# Patient Record
Sex: Male | Born: 1955 | Race: White | Hispanic: No | Marital: Married | Smoking: Current some day smoker
Health system: Southern US, Community
[De-identification: ages and names within clinical notes are randomized; demographics above are authoritative.]

## PROBLEM LIST (undated history)

## (undated) DIAGNOSIS — H35039 Hypertensive retinopathy, unspecified eye: Secondary | ICD-10-CM

## (undated) DIAGNOSIS — H269 Unspecified cataract: Secondary | ICD-10-CM

## (undated) HISTORY — DX: Unspecified cataract: H26.9

## (undated) HISTORY — PX: VASECTOMY: SHX75

## (undated) HISTORY — DX: Hypertensive retinopathy, unspecified eye: H35.039

---

## 1998-11-07 ENCOUNTER — Other Ambulatory Visit: Admission: RE | Admit: 1998-11-07 | Discharge: 1998-11-07 | Payer: Self-pay | Admitting: Urology

## 2014-10-23 ENCOUNTER — Emergency Department (HOSPITAL_COMMUNITY)
Admission: EM | Admit: 2014-10-23 | Discharge: 2014-10-23 | Disposition: A | Discharge status: Home / Self Care | Payer: 59 | Attending: Emergency Medicine | Admitting: Emergency Medicine

## 2014-10-23 ENCOUNTER — Encounter (HOSPITAL_COMMUNITY): Payer: Self-pay | Admitting: Emergency Medicine

## 2014-10-23 DIAGNOSIS — Y9389 Activity, other specified: Secondary | ICD-10-CM | POA: Diagnosis not present

## 2014-10-23 DIAGNOSIS — Z720 Tobacco use: Secondary | ICD-10-CM | POA: Insufficient documentation

## 2014-10-23 DIAGNOSIS — S51812A Laceration without foreign body of left forearm, initial encounter: Secondary | ICD-10-CM | POA: Diagnosis not present

## 2014-10-23 DIAGNOSIS — Y288XXA Contact with other sharp object, undetermined intent, initial encounter: Secondary | ICD-10-CM | POA: Insufficient documentation

## 2014-10-23 DIAGNOSIS — Z23 Encounter for immunization: Secondary | ICD-10-CM | POA: Insufficient documentation

## 2014-10-23 DIAGNOSIS — Y9289 Other specified places as the place of occurrence of the external cause: Secondary | ICD-10-CM | POA: Diagnosis not present

## 2014-10-23 MED ORDER — TETANUS-DIPHTH-ACELL PERTUSSIS 5-2.5-18.5 LF-MCG/0.5 IM SUSP
0.5000 mL | Freq: Once | INTRAMUSCULAR | Status: AC
  Administered 2014-10-23: 0.5 mL via INTRAMUSCULAR
  Filled 2014-10-23: qty 0.5

## 2014-10-23 MED ORDER — LIDOCAINE-EPINEPHRINE (PF) 2 %-1:200000 IJ SOLN
10.0000 mL | Freq: Once | INTRAMUSCULAR | Status: AC
  Administered 2014-10-23: 10 mL
  Filled 2014-10-23: qty 10

## 2014-10-23 NOTE — ED Notes (Signed)
Bleeding is controlled at this time.    
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
## 2014-10-23 NOTE — ED Provider Notes (Signed)
CSN: [id number]     Arrival date & time 10/23/14  2055 History  This chart was scribed for non-physician practitioner Alphonzo DublinKelly Heumes, PA-C working with Toy BakerAnthony T Allen, MD by Conchita ParisNadim Abuhashem, ED Scribe. This patient was seen in WTR7/WTR7 and the patient's care was started at 10:13 PM.   Chief Complaint  Patient presents with  . Extremity Laceration   HPI HPI Comments: Philip Fox is a 58 y.o. male who presents to the Emergency Department complaining of a right arm laceration at 8:30 pm. He cut himself on the sharp edge of his microwave while trying to lift up and it twisted into his arm. He rates his pain as 2/10 but when uncovers the wound he rates it as 5/10. Pain is "like a bruise". He denies loss of sensation in his left hand. Last tetanus unknown.  History reviewed. No pertinent past medical history. Past Surgical History  Procedure Laterality Date  . Vasectomy     No family history on file. History  Substance Use Topics  . Smoking status: Current Some Day Smoker  . Smokeless tobacco: Not on file  . Alcohol Use: Yes    Review of Systems  Skin: Positive for wound.  Neurological: Negative for numbness.  All other systems reviewed and are negative.   Allergies  Review of patient's allergies indicates no known allergies.  Home Medications   Prior to Admission medications   Not on File   BP 125/84 mmHg  Pulse 65  Temp(Src) 98.8 F (37.1 C) (Oral)  Resp 16  SpO2 100%   Physical Exam  Constitutional: He is oriented to person, place, and time. He appears well-developed and well-nourished. No distress.  Nontoxic/nonseptic appearing  HENT:  Head: Normocephalic and atraumatic.  Eyes: Conjunctivae and EOM are normal. No scleral icterus.  Neck: Normal range of motion.  Cardiovascular: Normal rate, regular rhythm and intact distal pulses.   Pulmonary/Chest: Effort normal. No respiratory distress.  Musculoskeletal: Normal range of motion.       Left forearm: He exhibits  laceration.       Arms: Normal ROM of L forearm. Normal pronation and supination. No exposed muscles or tendon.  Neurological: He is alert and oriented to person, place, and time. He exhibits normal muscle tone. Coordination normal.  Sensation to light touch intact.  Skin: Skin is warm and dry. No rash noted. He is not diaphoretic. No erythema. No pallor.  4cm laceration to the volar L forearm  Psychiatric: He has a normal mood and affect. His behavior is normal.  Nursing note and vitals reviewed.   ED Course  Procedures  DIAGNOSTIC STUDIES: Oxygen Saturation is 100% on room air, normal by my interpretation.    COORDINATION OF CARE: 10:18 PM Discussed treatment plan with pt at bedside and pt agreed to plan.  Labs Review Labs Reviewed - No data to display  Imaging Review No results found.   EKG Interpretation None     LACERATION REPAIR Performed by: Antony MaduraHUMES, Sunshyne Horvath Authorized by: Antony MaduraHUMES, Fleetwood Pierron Consent: Verbal consent obtained. Risks and benefits: risks, benefits and alternatives were discussed Consent given by: patient Patient identity confirmed: provided demographic data Prepped and Draped in normal sterile fashion Wound explored  Laceration Location: L volar forearm  Laceration Length: 4.5cm  No Foreign Bodies seen or palpated  Anesthesia: local infiltration  Local anesthetic: lidocaine 2% with epinephrine  Anesthetic total: 8 ml  Irrigation method: syringe Amount of cleaning: standard  Skin closure: 5-0 ethilon  Number of sutures: 6  Technique: simple interrupted  Patient tolerance: Patient tolerated the procedure well with no immediate complications.  MDM   Final diagnoses:  Laceration of forearm, left, initial encounter    Tdap booster given. Pressure irrigation performed. Laceration occurred < 8 hours prior to repair which was well tolerated. Pt has no comorbidities to effect normal wound healing. Discussed suture home care with pt and answered  questions. Pt to follow up for wound check and suture removal in 7 days. Pt is hemodynamically stable with no complaints prior to dc. Patient agreeable to plan with no unaddressed concerns.   Filed Vitals:   10/23/14 2113  BP: 125/84  Pulse: 65  Temp: 98.8 F (37.1 C)  TempSrc: Oral  Resp: 16  SpO2: 100%     Antony MaduraKelly Francesco Provencal, PA-C 10/23/14 2320

## 2014-10-23 NOTE — ED Notes (Signed)
Applied a clean dressing to suture. Applied Bacitracin to sutures. Covered with 4x4 gauze and kerlix. Secured with tape. Pt tolerated it well.

## 2014-10-23 NOTE — ED Notes (Signed)
Pt states he was putting in a microwave and cut his L forearm. Bleeding controlled at this time. Alert and oriented.

## 2014-10-23 NOTE — Discharge Instructions (Signed)

## 2018-06-15 ENCOUNTER — Ambulatory Visit
Admission: RE | Admit: 2018-06-15 | Discharge: 2018-06-15 | Disposition: A | Discharge status: Home / Self Care | Payer: BLUE CROSS/BLUE SHIELD | Source: Ambulatory Visit | Attending: Family Medicine | Admitting: Family Medicine

## 2018-06-15 ENCOUNTER — Other Ambulatory Visit: Payer: Self-pay | Admitting: Family Medicine

## 2018-06-15 DIAGNOSIS — M5416 Radiculopathy, lumbar region: Secondary | ICD-10-CM

## 2018-06-15 DIAGNOSIS — M25551 Pain in right hip: Secondary | ICD-10-CM | POA: Diagnosis not present

## 2018-06-15 DIAGNOSIS — M79604 Pain in right leg: Secondary | ICD-10-CM | POA: Diagnosis not present

## 2018-06-15 DIAGNOSIS — M5136 Other intervertebral disc degeneration, lumbar region: Secondary | ICD-10-CM | POA: Diagnosis not present

## 2019-05-26 DIAGNOSIS — L02519 Cutaneous abscess of unspecified hand: Secondary | ICD-10-CM | POA: Diagnosis not present

## 2019-07-04 DIAGNOSIS — L255 Unspecified contact dermatitis due to plants, except food: Secondary | ICD-10-CM | POA: Diagnosis not present

## 2019-07-26 DIAGNOSIS — H6123 Impacted cerumen, bilateral: Secondary | ICD-10-CM | POA: Diagnosis not present

## 2019-07-26 DIAGNOSIS — H903 Sensorineural hearing loss, bilateral: Secondary | ICD-10-CM | POA: Diagnosis not present

## 2019-07-26 DIAGNOSIS — H9313 Tinnitus, bilateral: Secondary | ICD-10-CM | POA: Diagnosis not present

## 2019-09-29 IMAGING — CR DG LUMBAR SPINE COMPLETE 4+V
5 series · 5 of 5 positions shown · non-contrast
Comparison: None.

CLINICAL DATA: Right hip pain for 3 months.

EXAM:
LUMBAR SPINE - COMPLETE 4+ VIEW

[w lumbar spine ap]
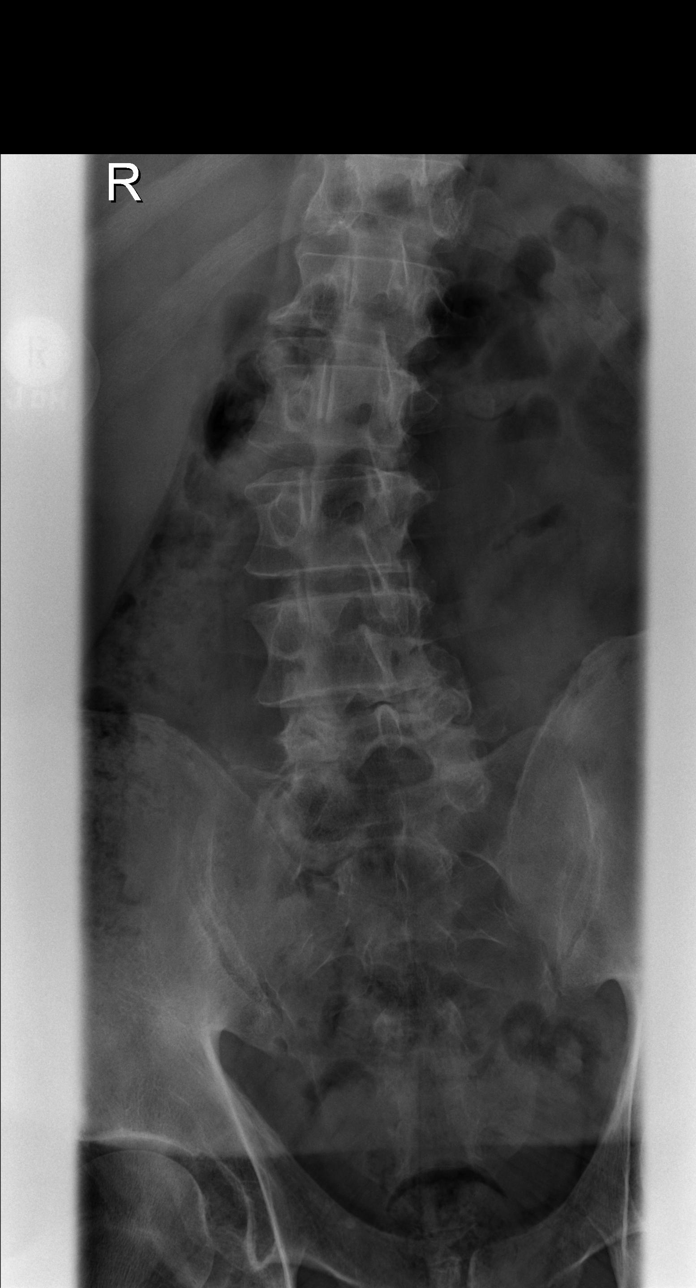

[w lumbar spine obl (1 of 2)]
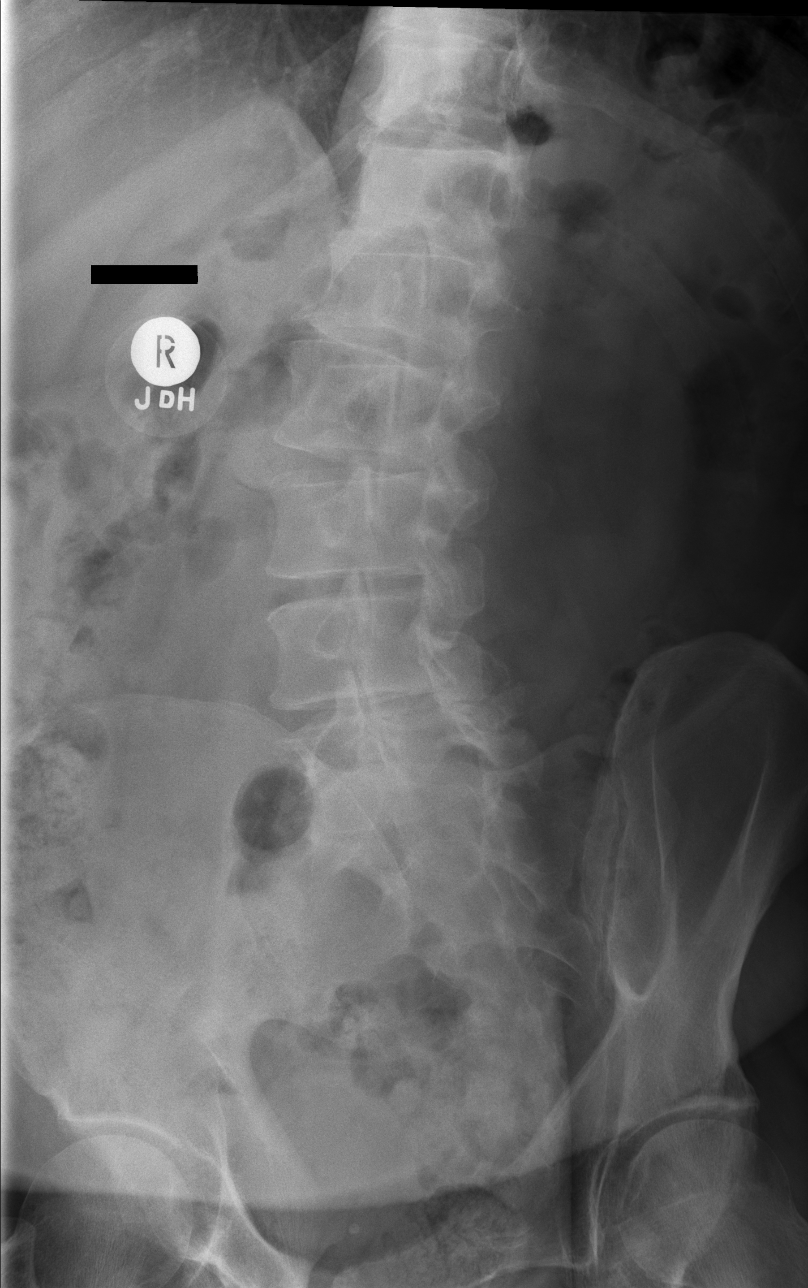

[w lumbar spine obl (2 of 2)]
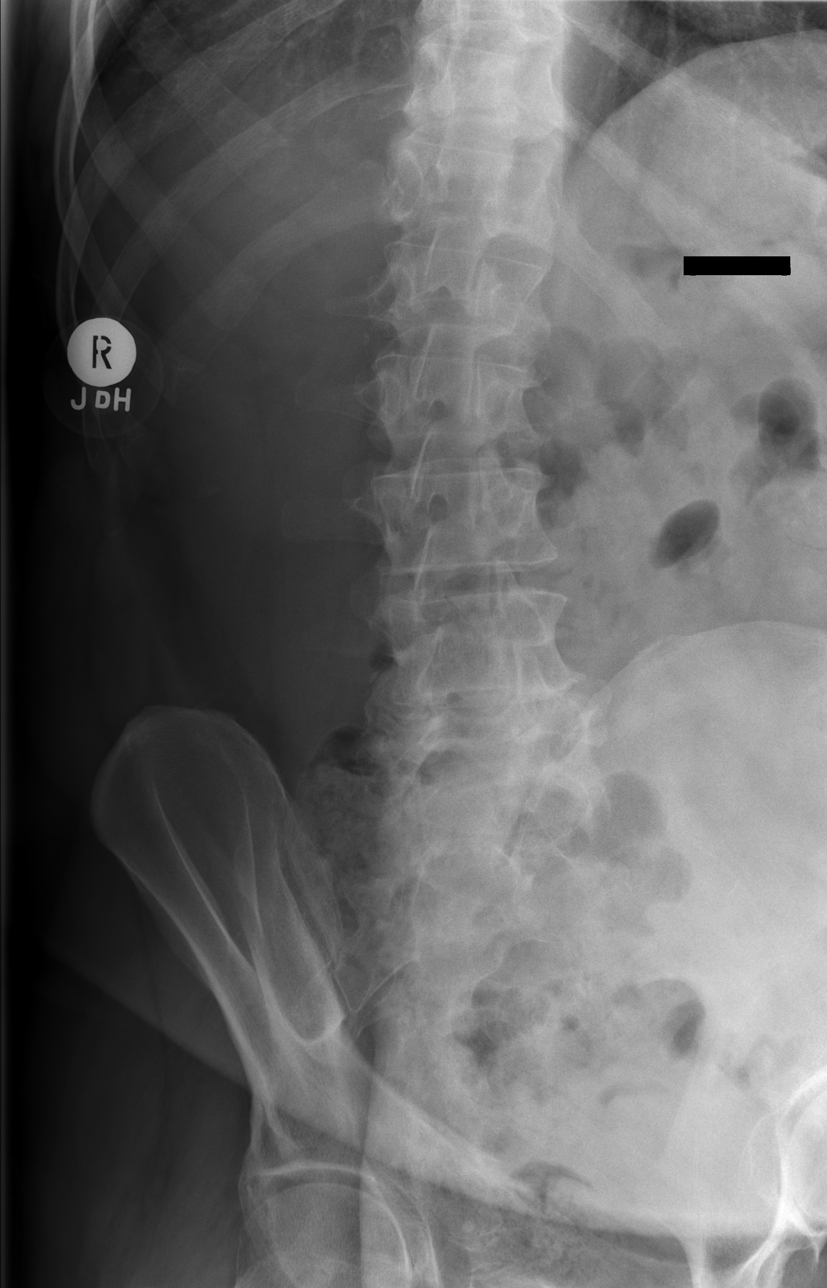

[w lumbar spine lat]
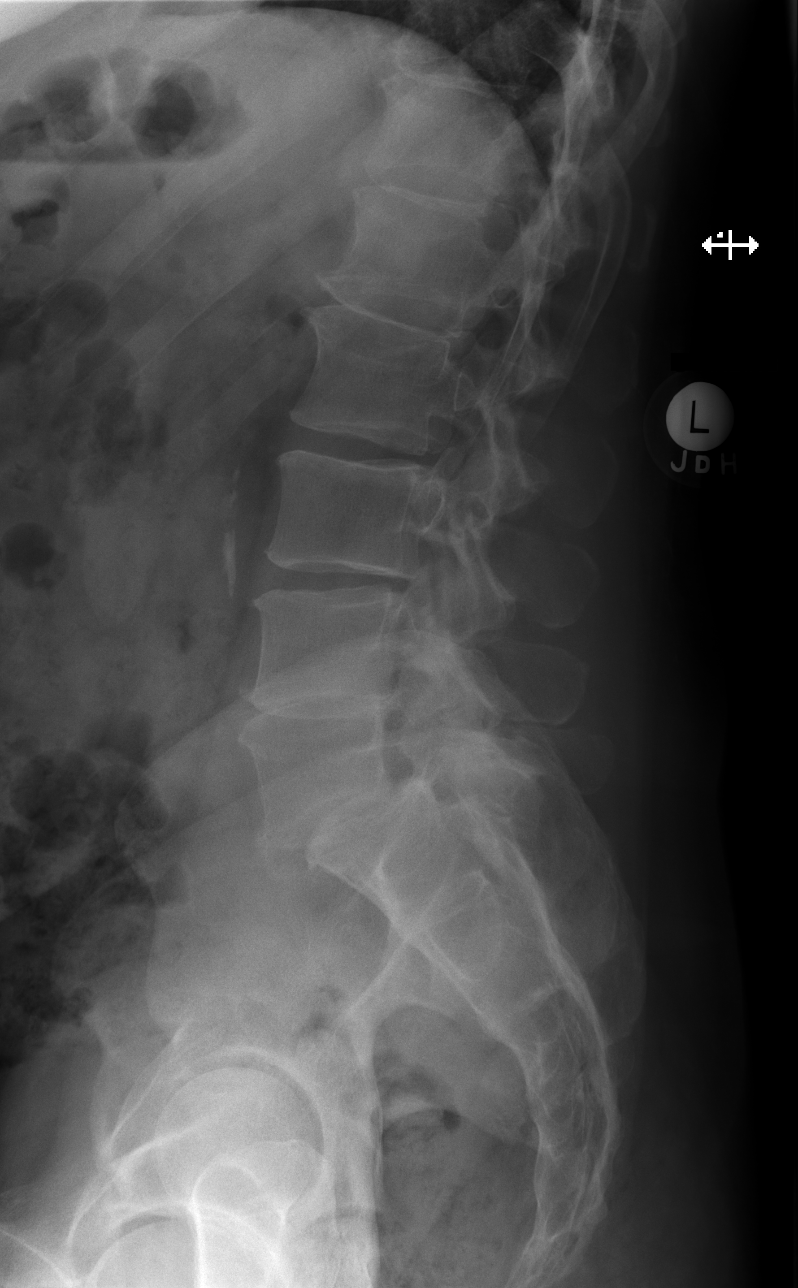

[w lumbar l-5 s-1 spot]
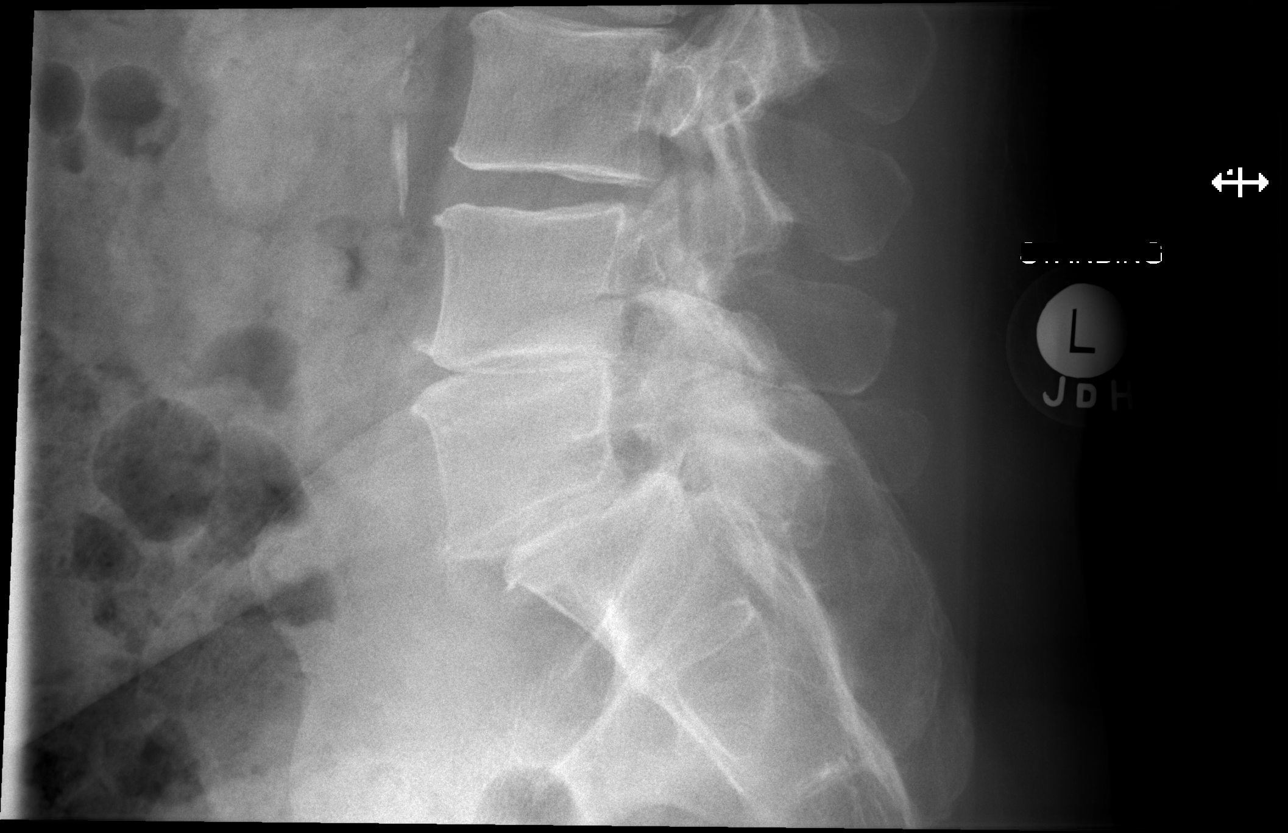

[5 of 5 positions shown; findings below may reference images not displayed]

FINDINGS: Mild dextroscoliosis of lumbar spine is noted. Grade 1
anterolisthesis of L5-S1 is noted most likely due to posterior facet
joint hypertrophy. Severe degenerative disc disease is noted at L4-5
and L5-S1. Mild degenerative disc disease is noted at L1-2.
IMPRESSION: Multilevel degenerative disc disease. No acute abnormality seen in
the lumbar spine.

## 2019-10-24 DIAGNOSIS — L570 Actinic keratosis: Secondary | ICD-10-CM | POA: Diagnosis not present

## 2019-10-24 DIAGNOSIS — L821 Other seborrheic keratosis: Secondary | ICD-10-CM | POA: Diagnosis not present

## 2021-02-04 ENCOUNTER — Ambulatory Visit (INDEPENDENT_AMBULATORY_CARE_PROVIDER_SITE_OTHER): Payer: No Typology Code available for payment source | Admitting: Ophthalmology

## 2021-02-04 ENCOUNTER — Encounter (INDEPENDENT_AMBULATORY_CARE_PROVIDER_SITE_OTHER): Payer: Self-pay | Admitting: Ophthalmology

## 2021-02-04 ENCOUNTER — Other Ambulatory Visit: Payer: Self-pay

## 2021-02-04 DIAGNOSIS — H43812 Vitreous degeneration, left eye: Secondary | ICD-10-CM

## 2021-02-04 DIAGNOSIS — H3581 Retinal edema: Secondary | ICD-10-CM

## 2021-02-04 DIAGNOSIS — H25813 Combined forms of age-related cataract, bilateral: Secondary | ICD-10-CM

## 2021-02-04 DIAGNOSIS — I10 Essential (primary) hypertension: Secondary | ICD-10-CM | POA: Diagnosis not present

## 2021-02-04 DIAGNOSIS — H35033 Hypertensive retinopathy, bilateral: Secondary | ICD-10-CM | POA: Diagnosis not present

## 2021-02-04 NOTE — Progress Notes (Signed)
[facility] - Clinic Note  02/04/2021     CHIEF COMPLAINT Patient presents for Retina Evaluation   HISTORY OF PRESENT ILLNESS: Philip Fox is a 65 y.o. male who presents to the clinic today for:   HPI    Retina Evaluation    In left eye.  This started 5 days ago.  Duration of 5 days.  Associated Symptoms Flashes and Floaters.  Context:  distance vision, mid-range vision and near vision.  Treatments tried include no treatments.  I, the attending physician,  performed the HPI with the patient and updated documentation appropriately.          Comments    65 y/o male pt referred by Dr. Elmer Picker on 2.14.22 for eval of dot heme @ 0300 OS.  Pt began noticing floater OS about 5 days ago, and about 3 days ago began noticing occasional temporal FOL OS.  Symptoms seem to be more noticeable at night.  Around the time symptoms began, pt was doing some heavy lifting that is out of his normal routine.  VA good OU [place].  Denies pain.  No gtts.       Last edited by Rennis Chris, MD on 02/04/2021  9:02 AM. (History)    pt is here on the referral of Dr. Elmer Picker for concern of dot heme OS, pt saw Dr. Elmer Picker yesterday for new floaters and fol, he states the floaters started either last Thursday or Friday, he states the floater started out looking like a spider, he states the floater is a lot more noticeable at night, he denies shadow/veil in his vision, pt was moving band equipment around the time he noticed the floaters  Referring physician: Mateo Flow, MD [address] [place],  [place] [address]  HISTORICAL INFORMATION:   Selected notes from the MEDICAL RECORD NUMBER Referred by Dr. Mateo Flow for concern of retinal heme OS LEE:  Ocular Hx- PMH-    CURRENT MEDICATIONS: No current outpatient medications on file. (Ophthalmic Drugs)   No current facility-administered medications for this visit. (Ophthalmic Drugs)   No current outpatient medications on file.  (Other)   No current facility-administered medications for this visit. (Other)      REVIEW OF SYSTEMS: ROS    Positive for: Eyes   Negative for: Constitutional, Gastrointestinal, Neurological, Skin, Genitourinary, Musculoskeletal, HENT, Endocrine, Cardiovascular, Respiratory, Psychiatric, Allergic/Imm, Heme/Lymph   Last edited by Celine Mans, COA on 02/04/2021  8:54 AM. (History)       ALLERGIES No Known Allergies  PAST MEDICAL HISTORY Past Medical History:  Diagnosis Date  . Cataract    Mixed OU  . Hypertensive retinopathy    OU   Past Surgical History:  Procedure Laterality Date  . VASECTOMY      FAMILY HISTORY History reviewed. No pertinent family history.  SOCIAL HISTORY Social History   Tobacco Use  . Smoking status: Current Some Day Smoker  Substance Use Topics  . Alcohol use: Yes  . Drug use: No         OPHTHALMIC EXAM:  Base Eye Exam    Visual Acuity (Snellen - Linear)      Right Left   Dist [place] 20/30 -2 20/20 -   Dist ph [place] 20/20        Tonometry (Tonopen, 8:55 AM)      Right Left   Pressure 16 14       Pupils      Dark Light Shape React APD  Right 3 2 Round Brisk None   Left 3 2 Round Brisk None       Visual Fields (Counting fingers)      Left Right    Full Full       Extraocular Movement      Right Left    Full, Ortho Full, Ortho       Neuro/Psych    Oriented x3: Yes   Mood/Affect: Normal       Dilation    Both eyes: 1.0% Mydriacyl, 2.5% Phenylephrine @ 8:55 AM        Slit Lamp and Fundus Exam    Slit Lamp Exam      Right Left   Lids/Lashes Dermatochalasis - upper lid Dermatochalasis - upper lid   Conjunctiva/Sclera White and quiet White and quiet   Cornea trace tear film debris trace tear film debris   Anterior Chamber deep, clear, narrow temporal angle deep, clear, narrow temporal angle   Iris Round and moderately dilated to 56mm Round and moderately dilated to 28mm   Lens 2+ Nuclear sclerosis, 2+ Cortical  cataract 2+ Nuclear sclerosis, 2+ Cortical cataract   Vitreous Vitreous syneresis Vitreous syneresis, Posterior vitreous detachment, vitreous condensations       Fundus Exam      Right Left   Disc Pink and Sharp, Compact Pink and Sharp, focal PPP inferiorly   C/D Ratio 0.4 0.5   Macula Flat, Blunted foveal reflex, mild RPE mottling, No heme or edema Flat, Blunted foveal reflex, mild RPE mottling, No heme or edema   Vessels mild attenuation, mild tortuousity mild attenuation, mild tortuousity   Periphery Attached    Attached, oral bay cyst at 0130, punctate IRH at 0300; no RT/RD on 360 scleral depression        Refraction    Manifest Refraction      Sphere Cylinder Dist VA   Right +0.75 Sphere 20/20-   Left +0.25 Sphere 20/20          IMAGING AND PROCEDURES  Imaging and Procedures for 02/04/2021  OCT, Retina - OU - Both Eyes       Right Eye Quality was good. Central Foveal Thickness: 275. Progression has no prior data. Findings include normal foveal contour, no IRF, no SRF, vitreomacular adhesion .   Left Eye Quality was good. Central Foveal Thickness: 271. Progression has no prior data. Findings include normal foveal contour, no IRF, no SRF (Trace vitreous opacities).   Notes *Images captured and stored on drive  Diagnosis / Impression:  NFP, no IRF/SRF OU OS with trace vitreous opacities  Clinical management:  See below  Abbreviations: NFP - Normal foveal profile. CME - cystoid macular edema. PED - pigment epithelial detachment. IRF - intraretinal fluid. SRF - subretinal fluid. EZ - ellipsoid zone. ERM - epiretinal membrane. ORA - outer retinal atrophy. ORT - outer retinal tubulation. SRHM - subretinal hyper-reflective material. IRHM - intraretinal hyper-reflective material                ASSESSMENT/PLAN:    ICD-10-CM   1. Posterior vitreous detachment of left eye  H43.812   2. Retinal edema  H35.81 OCT, Retina - OU - Both Eyes  3. Essential hypertension   I10   4. Hypertensive retinopathy of both eyes  H35.033   5. Combined forms of age-related cataract of both eyes  H25.813     1,2. PVD / vitreous syneresis OS  - symptomatic flashes/floaters OS x 1 wk  - exam with focal  punctate IRH temporal periphery OS  - No RT or RD on 360 scleral depressed exam  - Discussed findings and prognosis  - Reviewed s/s of RT/RD  - Strict return precautions for any such RT/RD signs/symptoms  - f/u in 4-6 wks, sooner prn -- DFE/OCT  3,4. Hypertensive retinopathy OU - discussed importance of tight BP control - monitor  5. Mixed Cataract OU - The symptoms of cataract, surgical options, and treatments and risks were discussed with patient. - discussed diagnosis and progression - not yet visually significant - monitor for now  Ophthalmic Meds Ordered this visit:  No orders of the defined types were placed in this encounter.     Return for f/u 4-6 weeks, PVD OS, DFE, OCT.  There are no Patient Instructions on file for this visit.   Explained the diagnoses, plan, and follow up with the patient and they expressed understanding.  Patient expressed understanding of the importance of proper follow up care.   This document serves as a record of services personally performed by Karie Chimera, MD, PhD. It was created on their behalf by Glee Arvin. Manson Passey, OA an ophthalmic technician. The creation of this record is the provider's dictation and/or activities during the visit.    Electronically signed by: Glee Arvin. Manson Passey, [place] 02.15.2022 12:10 PM   Karie Chimera, M.D., Ph.D. Diseases & Surgery of the Retina and Vitreous [facility]  I have reviewed the above documentation for accuracy and completeness, and I agree with the above. Karie Chimera, M.D., Ph.D. 02/04/21 12:10 PM   Abbreviations: M myopia (nearsighted); A astigmatism; H hyperopia (farsighted); P presbyopia; Mrx spectacle prescription;  CTL contact lenses; OD right eye;  OS left eye; OU both eyes  XT exotropia; ET esotropia; PEK punctate epithelial keratitis; PEE punctate epithelial erosions; DES dry eye syndrome; MGD meibomian gland dysfunction; ATs artificial tears; PFAT's preservative free artificial tears; NSC nuclear sclerotic cataract; PSC posterior subcapsular cataract; ERM epi-retinal membrane; PVD posterior vitreous detachment; RD retinal detachment; DM diabetes mellitus; DR diabetic retinopathy; NPDR non-proliferative diabetic retinopathy; PDR proliferative diabetic retinopathy; CSME clinically significant macular edema; DME diabetic macular edema; dbh dot blot hemorrhages; CWS cotton wool spot; POAG primary open angle glaucoma; C/D cup-to-disc ratio; HVF humphrey visual field; GVF goldmann visual field; OCT optical coherence tomography; IOP intraocular pressure; BRVO Branch retinal vein occlusion; CRVO central retinal vein occlusion; CRAO central retinal artery occlusion; BRAO branch retinal artery occlusion; RT retinal tear; SB scleral buckle; PPV pars plana vitrectomy; VH Vitreous hemorrhage; PRP panretinal laser photocoagulation; IVK intravitreal kenalog; VMT vitreomacular traction; MH Macular hole;  NVD neovascularization of the disc; NVE neovascularization elsewhere; AREDS age related eye disease study; ARMD age related macular degeneration; POAG primary open angle glaucoma; EBMD epithelial/anterior basement membrane dystrophy; ACIOL anterior chamber intraocular lens; IOL intraocular lens; PCIOL posterior chamber intraocular lens; Phaco/IOL phacoemulsification with intraocular lens placement; PRK photorefractive keratectomy; LASIK laser assisted in situ keratomileusis; HTN hypertension; DM diabetes mellitus; COPD chronic obstructive pulmonary disease

## 2021-03-05 ENCOUNTER — Encounter (INDEPENDENT_AMBULATORY_CARE_PROVIDER_SITE_OTHER): Payer: No Typology Code available for payment source | Admitting: Ophthalmology

## 2023-09-27 DIAGNOSIS — H9190 Unspecified hearing loss, unspecified ear: Secondary | ICD-10-CM | POA: Diagnosis not present

## 2024-01-21 DIAGNOSIS — Z01118 Encounter for examination of ears and hearing with other abnormal findings: Secondary | ICD-10-CM | POA: Diagnosis not present

## 2024-01-21 DIAGNOSIS — H9193 Unspecified hearing loss, bilateral: Secondary | ICD-10-CM | POA: Diagnosis not present

## 2024-01-21 DIAGNOSIS — H903 Sensorineural hearing loss, bilateral: Secondary | ICD-10-CM | POA: Diagnosis not present

## 2024-01-21 DIAGNOSIS — H9313 Tinnitus, bilateral: Secondary | ICD-10-CM | POA: Diagnosis not present

## 2024-03-14 DIAGNOSIS — C44519 Basal cell carcinoma of skin of other part of trunk: Secondary | ICD-10-CM | POA: Diagnosis not present

## 2024-03-14 DIAGNOSIS — D485 Neoplasm of uncertain behavior of skin: Secondary | ICD-10-CM | POA: Diagnosis not present

## 2024-03-14 DIAGNOSIS — Z129 Encounter for screening for malignant neoplasm, site unspecified: Secondary | ICD-10-CM | POA: Diagnosis not present

## 2024-03-14 DIAGNOSIS — Z808 Family history of malignant neoplasm of other organs or systems: Secondary | ICD-10-CM | POA: Diagnosis not present

## 2024-03-14 DIAGNOSIS — L570 Actinic keratosis: Secondary | ICD-10-CM | POA: Diagnosis not present

## 2024-03-14 DIAGNOSIS — D2362 Other benign neoplasm of skin of left upper limb, including shoulder: Secondary | ICD-10-CM | POA: Diagnosis not present

## 2024-03-15 DIAGNOSIS — F40240 Claustrophobia: Secondary | ICD-10-CM | POA: Diagnosis not present

## 2024-03-28 DIAGNOSIS — D239 Other benign neoplasm of skin, unspecified: Secondary | ICD-10-CM | POA: Diagnosis not present

## 2024-05-02 DIAGNOSIS — M10071 Idiopathic gout, right ankle and foot: Secondary | ICD-10-CM | POA: Diagnosis not present

## 2024-05-02 DIAGNOSIS — G629 Polyneuropathy, unspecified: Secondary | ICD-10-CM | POA: Diagnosis not present

## 2024-05-22 DIAGNOSIS — L820 Inflamed seborrheic keratosis: Secondary | ICD-10-CM | POA: Diagnosis not present

## 2024-10-10 DIAGNOSIS — L570 Actinic keratosis: Secondary | ICD-10-CM | POA: Diagnosis not present

## 2024-10-10 DIAGNOSIS — Z85828 Personal history of other malignant neoplasm of skin: Secondary | ICD-10-CM | POA: Diagnosis not present

## 2024-10-10 DIAGNOSIS — Z129 Encounter for screening for malignant neoplasm, site unspecified: Secondary | ICD-10-CM | POA: Diagnosis not present
